# Patient Record
Sex: Female | Born: 1945 | Race: White | Hispanic: No | Marital: Married | Smoking: Never smoker
Health system: Southern US, Community
[De-identification: ages and names within clinical notes are randomized; demographics above are authoritative.]

## PROBLEM LIST (undated history)

## (undated) DIAGNOSIS — M797 Fibromyalgia: Secondary | ICD-10-CM

## (undated) DIAGNOSIS — M1990 Unspecified osteoarthritis, unspecified site: Secondary | ICD-10-CM

## (undated) DIAGNOSIS — C50919 Malignant neoplasm of unspecified site of unspecified female breast: Secondary | ICD-10-CM

## (undated) DIAGNOSIS — K219 Gastro-esophageal reflux disease without esophagitis: Secondary | ICD-10-CM

## (undated) DIAGNOSIS — E079 Disorder of thyroid, unspecified: Secondary | ICD-10-CM

## (undated) DIAGNOSIS — N200 Calculus of kidney: Secondary | ICD-10-CM

## (undated) DIAGNOSIS — G629 Polyneuropathy, unspecified: Secondary | ICD-10-CM

## (undated) DIAGNOSIS — M889 Osteitis deformans of unspecified bone: Secondary | ICD-10-CM

## (undated) DIAGNOSIS — G4730 Sleep apnea, unspecified: Secondary | ICD-10-CM

## (undated) DIAGNOSIS — E119 Type 2 diabetes mellitus without complications: Secondary | ICD-10-CM

## (undated) DIAGNOSIS — I10 Essential (primary) hypertension: Secondary | ICD-10-CM

## (undated) HISTORY — PX: ABDOMINAL HYSTERECTOMY: SHX81

## (undated) HISTORY — PX: KIDNEY STONE SURGERY: SHX686

## (undated) HISTORY — PX: TONSILLECTOMY: SUR1361

## (undated) HISTORY — PX: BREAST LUMPECTOMY: SHX2

---

## 2018-02-28 ENCOUNTER — Emergency Department (HOSPITAL_COMMUNITY): Payer: Medicare Other

## 2018-02-28 ENCOUNTER — Encounter (HOSPITAL_COMMUNITY): Payer: Self-pay | Admitting: Emergency Medicine

## 2018-02-28 ENCOUNTER — Other Ambulatory Visit: Payer: Self-pay

## 2018-02-28 ENCOUNTER — Emergency Department (HOSPITAL_COMMUNITY)
Admission: EM | Admit: 2018-02-28 | Discharge: 2018-02-28 | Disposition: A | Discharge status: Home / Self Care | Payer: Medicare Other | Attending: Emergency Medicine | Admitting: Emergency Medicine

## 2018-02-28 DIAGNOSIS — S0083XA Contusion of other part of head, initial encounter: Secondary | ICD-10-CM | POA: Diagnosis not present

## 2018-02-28 DIAGNOSIS — S80211A Abrasion, right knee, initial encounter: Secondary | ICD-10-CM | POA: Insufficient documentation

## 2018-02-28 DIAGNOSIS — S0232XA Fracture of orbital floor, left side, initial encounter for closed fracture: Secondary | ICD-10-CM | POA: Insufficient documentation

## 2018-02-28 DIAGNOSIS — Y92524 Gas station as the place of occurrence of the external cause: Secondary | ICD-10-CM | POA: Diagnosis not present

## 2018-02-28 DIAGNOSIS — S80212A Abrasion, left knee, initial encounter: Secondary | ICD-10-CM | POA: Diagnosis not present

## 2018-02-28 DIAGNOSIS — I10 Essential (primary) hypertension: Secondary | ICD-10-CM | POA: Diagnosis not present

## 2018-02-28 DIAGNOSIS — S42202A Unspecified fracture of upper end of left humerus, initial encounter for closed fracture: Secondary | ICD-10-CM | POA: Insufficient documentation

## 2018-02-28 DIAGNOSIS — W010XXA Fall on same level from slipping, tripping and stumbling without subsequent striking against object, initial encounter: Secondary | ICD-10-CM | POA: Insufficient documentation

## 2018-02-28 DIAGNOSIS — W19XXXA Unspecified fall, initial encounter: Secondary | ICD-10-CM

## 2018-02-28 DIAGNOSIS — Y999 Unspecified external cause status: Secondary | ICD-10-CM | POA: Diagnosis not present

## 2018-02-28 DIAGNOSIS — S0240DA Maxillary fracture, left side, initial encounter for closed fracture: Secondary | ICD-10-CM | POA: Insufficient documentation

## 2018-02-28 DIAGNOSIS — Z79899 Other long term (current) drug therapy: Secondary | ICD-10-CM | POA: Diagnosis not present

## 2018-02-28 DIAGNOSIS — E079 Disorder of thyroid, unspecified: Secondary | ICD-10-CM | POA: Diagnosis not present

## 2018-02-28 DIAGNOSIS — Z7984 Long term (current) use of oral hypoglycemic drugs: Secondary | ICD-10-CM | POA: Insufficient documentation

## 2018-02-28 DIAGNOSIS — S50312A Abrasion of left elbow, initial encounter: Secondary | ICD-10-CM | POA: Diagnosis not present

## 2018-02-28 DIAGNOSIS — E119 Type 2 diabetes mellitus without complications: Secondary | ICD-10-CM | POA: Insufficient documentation

## 2018-02-28 DIAGNOSIS — Y9389 Activity, other specified: Secondary | ICD-10-CM | POA: Insufficient documentation

## 2018-02-28 DIAGNOSIS — S0990XA Unspecified injury of head, initial encounter: Secondary | ICD-10-CM | POA: Diagnosis present

## 2018-02-28 HISTORY — DX: Unspecified osteoarthritis, unspecified site: M19.90

## 2018-02-28 HISTORY — DX: Calculus of kidney: N20.0

## 2018-02-28 HISTORY — DX: Essential (primary) hypertension: I10

## 2018-02-28 HISTORY — DX: Sleep apnea, unspecified: G47.30

## 2018-02-28 HISTORY — DX: Polyneuropathy, unspecified: G62.9

## 2018-02-28 HISTORY — DX: Osteitis deformans of unspecified bone: M88.9

## 2018-02-28 HISTORY — DX: Malignant neoplasm of unspecified site of unspecified female breast: C50.919

## 2018-02-28 HISTORY — DX: Fibromyalgia: M79.7

## 2018-02-28 HISTORY — DX: Type 2 diabetes mellitus without complications: E11.9

## 2018-02-28 HISTORY — DX: Disorder of thyroid, unspecified: E07.9

## 2018-02-28 HISTORY — DX: Gastro-esophageal reflux disease without esophagitis: K21.9

## 2018-02-28 MED ORDER — CLINDAMYCIN HCL 150 MG PO CAPS: ORAL_CAPSULE | ORAL | 0 refills | Status: DC

## 2018-02-28 MED ORDER — OXYCODONE-ACETAMINOPHEN 5-325 MG PO TABS
1.0000 | ORAL_TABLET | Freq: Once | ORAL | Status: AC
  Administered 2018-02-28: 1 via ORAL
  Filled 2018-02-28: qty 1

## 2018-02-28 MED ORDER — TETRACAINE HCL 0.5 % OP SOLN
OPHTHALMIC | Status: AC
  Administered 2018-02-28: 20:00:00
  Filled 2018-02-28: qty 4

## 2018-02-28 MED ORDER — FLUORESCEIN SODIUM 1 MG OP STRP
1.0000 | ORAL_STRIP | Freq: Once | OPHTHALMIC | Status: AC
  Administered 2018-02-28: 1 via OPHTHALMIC
  Filled 2018-02-28: qty 1

## 2018-02-28 MED ORDER — OXYCODONE-ACETAMINOPHEN 5-325 MG PO TABS: ORAL_TABLET | ORAL | 0 refills | Status: DC

## 2018-02-28 NOTE — ED Notes (Signed)
ED Provider at bedside. 
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
## 2018-02-28 NOTE — Discharge Instructions (Signed)
Take the prescriptions as directed.  You will want to keep your head slightly elevated for the next few days, especially at night, to help decrease the swelling around your eye area. Apply ice to the area(s) of discomfort, for 15 minutes at a time, several times per day for the next few days.  Do not fall asleep on a heating or ice pack.  Wear the sling until you are seen in follow up. No NOT: forcefully blow your nose or sneeze, sneeze with your mouth open if needed, no chewing, no gum, no candy. Eat a liquid diet until seen in follow up by the ENT doctor. Call your regular medical doctor tomorrow to schedule a follow up appointment this week.  You will also need to call the Orthopedic doctor tomorrow to schedule a follow up appointment within the next week for your proximal humeral fracture.  You will also want to call the ENT doctor tomorrow to schedule a follow up appointment within the next week regarding your orbital floor and maxillary sinus bone fractures.  Return to the Emergency Department immediately if worsening.

## 2018-03-06 ENCOUNTER — Telehealth: Payer: Self-pay | Admitting: Orthopedic Surgery

## 2018-03-06 ENCOUNTER — Ambulatory Visit (INDEPENDENT_AMBULATORY_CARE_PROVIDER_SITE_OTHER): Payer: Medicare Other | Admitting: Orthopedic Surgery

## 2018-03-06 ENCOUNTER — Encounter: Payer: Self-pay | Admitting: Orthopedic Surgery

## 2018-03-06 VITALS — BP 153/84 | HR 81 | Ht 61.0 in | Wt 176.0 lb

## 2018-03-06 DIAGNOSIS — S42255A Nondisplaced fracture of greater tuberosity of left humerus, initial encounter for closed fracture: Secondary | ICD-10-CM

## 2018-03-06 MED ORDER — HYDROCODONE-ACETAMINOPHEN 5-325 MG PO TABS: 1.0000 | ORAL_TABLET | ORAL | 0 refills | Status: AC | PRN

## 2018-03-06 NOTE — Telephone Encounter (Signed)
Spoke with patient, called today following emergency room visit at [facility]. Scheduled to come to see Dr Aline Brochure for appointment today, 03/06/18; aware.
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
## 2018-03-16 DIAGNOSIS — S42202A Unspecified fracture of upper end of left humerus, initial encounter for closed fracture: Secondary | ICD-10-CM | POA: Insufficient documentation

## 2018-03-20 ENCOUNTER — Encounter: Payer: Self-pay | Admitting: Orthopedic Surgery

## 2018-03-20 ENCOUNTER — Ambulatory Visit (INDEPENDENT_AMBULATORY_CARE_PROVIDER_SITE_OTHER): Payer: Medicare Other | Admitting: Orthopedic Surgery

## 2018-03-20 ENCOUNTER — Ambulatory Visit (INDEPENDENT_AMBULATORY_CARE_PROVIDER_SITE_OTHER): Payer: Medicare Other

## 2018-03-20 VITALS — BP 139/60 | HR 79 | Ht 61.0 in | Wt 176.0 lb

## 2018-03-20 DIAGNOSIS — S42295A Other nondisplaced fracture of upper end of left humerus, initial encounter for closed fracture: Secondary | ICD-10-CM

## 2018-03-20 MED ORDER — IBUPROFEN 800 MG PO TABS: 800.0000 mg | ORAL_TABLET | Freq: Three times a day (TID) | ORAL | 1 refills | Status: DC | PRN

## 2018-03-20 MED ORDER — HYDROCODONE-ACETAMINOPHEN 5-325 MG PO TABS: 1.0000 | ORAL_TABLET | Freq: Four times a day (QID) | ORAL | 0 refills | Status: AC | PRN

## 2018-03-20 NOTE — Progress Notes (Signed)
Chief Complaint  Patient presents with  . Shoulder Pain    left   Injury date July 2  20 days post left proximal humerus fracture treated with sling  Complains of pain, taking ibuprofen 400 mg as needed  Sensory exam lateral shoulder normal swelling has decreased distal neurovascular function intact  X-ray shows stable fracture without regulation or displacement  Recommend start physical therapy return in 6 to 8 weeks  Encounter Diagnosis  Name Primary?  . Other closed nondisplaced fracture of proximal end of left humerus, initial encounter 02/28/18 Yes     Meds ordered this encounter  Medications  . ibuprofen (ADVIL,MOTRIN) 800 MG tablet    Sig: Take 1 tablet (800 mg total) by mouth every 8 (eight) hours as needed.    Dispense:  90 tablet    Refill:  1  . HYDROcodone-acetaminophen (NORCO/VICODIN) 5-325 MG tablet    Sig: Take 1 tablet by mouth every 6 (six) hours as needed for moderate pain.    Dispense:  30 tablet    Refill:  0

## 2018-03-21 ENCOUNTER — Telehealth: Payer: Self-pay | Admitting: Orthopedic Surgery

## 2018-03-21 NOTE — Telephone Encounter (Signed)
I received a call from [facility] in [place].  She said they received an order for PT for this patient.  She said the patient requested Gretchen Short to be her therapist.  The lady I spoke to said that Ms. Sherral Hammers does not work there at [employer], she works at [employer].  She gave the phone number for [facility] [phone] but did not know the fax phone.  She said to let her know if the patient was going to use [facility] or [facility] for her therapy.

## 2018-03-21 NOTE — Telephone Encounter (Signed)
Left message for [facility] to see if they will accept our referral, and if so what is the fax number.

## 2018-03-21 NOTE — Telephone Encounter (Signed)
They will call her to schedule 

## 2018-03-21 NOTE — Telephone Encounter (Signed)
They will take the referral fax number is [phone] I have faxed the referral

## 2018-04-10 ENCOUNTER — Telehealth: Payer: Self-pay | Admitting: Orthopedic Surgery

## 2018-04-10 NOTE — Telephone Encounter (Signed)
Gretchen Short with [facility] called stating that she needed clarification. Stated the patient is asking her when can she come out of the sling.  You can reach her at [phone] ext# [phone], if not it is a secure line so you can leave a message. Stated we can call [phone] also.  Please call and advise.

## 2018-04-12 NOTE — Telephone Encounter (Signed)
Physical therapist can wean her from sling as she progresses. I have called to advise, left message.

## 2018-05-02 ENCOUNTER — Other Ambulatory Visit: Payer: Self-pay | Admitting: Orthopedic Surgery

## 2018-05-02 DIAGNOSIS — S42295A Other nondisplaced fracture of upper end of left humerus, initial encounter for closed fracture: Secondary | ICD-10-CM

## 2018-05-22 ENCOUNTER — Ambulatory Visit (INDEPENDENT_AMBULATORY_CARE_PROVIDER_SITE_OTHER): Payer: Medicare Other | Admitting: Orthopedic Surgery

## 2018-05-22 ENCOUNTER — Encounter: Payer: Self-pay | Admitting: Orthopedic Surgery

## 2018-05-22 VITALS — BP 138/73 | HR 80 | Ht 61.0 in | Wt 183.0 lb

## 2018-05-22 DIAGNOSIS — S42295A Other nondisplaced fracture of upper end of left humerus, initial encounter for closed fracture: Secondary | ICD-10-CM

## 2018-05-22 MED ORDER — IBUPROFEN 800 MG PO TABS: 800.0000 mg | ORAL_TABLET | Freq: Three times a day (TID) | ORAL | 1 refills | Status: AC | PRN

## 2018-05-22 NOTE — Progress Notes (Signed)
Fracture care follow-up  Chief Complaint  Patient presents with  . Arm Injury    Left humeral fx 02/28/18    Encounter Diagnosis  Name Primary?  . Other closed nondisplaced fracture of proximal end of left humerus, initial encounter 02/28/18 Yes    Left proximal humerus fracture nondisplaced treated nonoperatively patient has recovered well complains of some soreness  BP 138/73   Pulse 80   Ht 5\' 1"  (1.549 m)   Wt 183 lb (83 kg)   BMI 34.58 kg/m   Physical Exam  Musculoskeletal:       Arms:    Xrays: Nondisplaced left proximal humerus fracture involves the greater tuberosity but it is nondisplaced surgical neck fracture as well.  X-ray was taken on July 22  Plan  Should be okay with home exercises and ibuprofen follow-up as needed released

## 2018-05-22 NOTE — Patient Instructions (Signed)
Home exercises  Ibuprofen as needed

## 2019-11-07 IMAGING — CT CT HEAD W/O CM
3 of 13 series · 14 of 47 positions shown, 16 images · non-contrast
Comparison: None.

CLINICAL DATA: Initial evaluation for acute head trauma.  Fall.

EXAM:
CT HEAD WITHOUT CONTRAST
CT MAXILLOFACIAL WITHOUT CONTRAST
CT CERVICAL SPINE WITHOUT CONTRAST
TECHNIQUE: Multidetector CT imaging of the head, cervical spine, and
maxillofacial structures were performed using the standard protocol
without intravenous contrast. Multiplanar CT image reconstructions
of the cervical spine and maxillofacial structures were also
generated.

[Series 7: max soft · axial · 0.33mm/px · z∈[+248,+368]mm · 6 of 85 slices shown]
[im 13/85  brain]
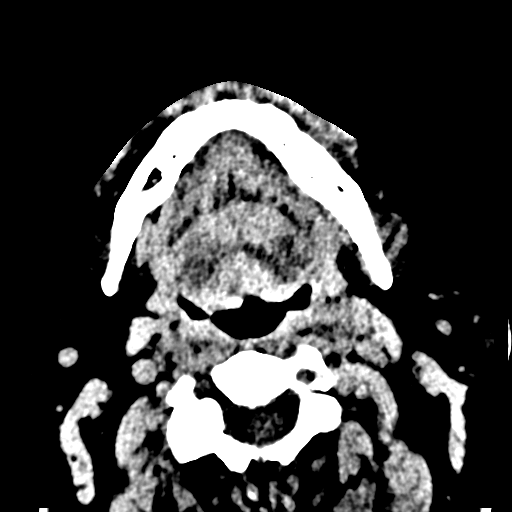
[im 25/85  brain]
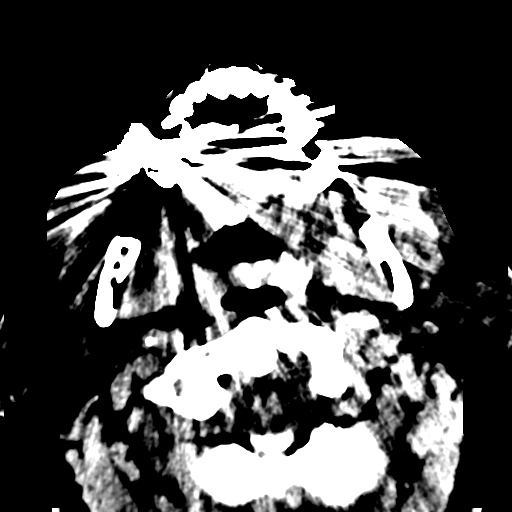
[im 37/85  brain]
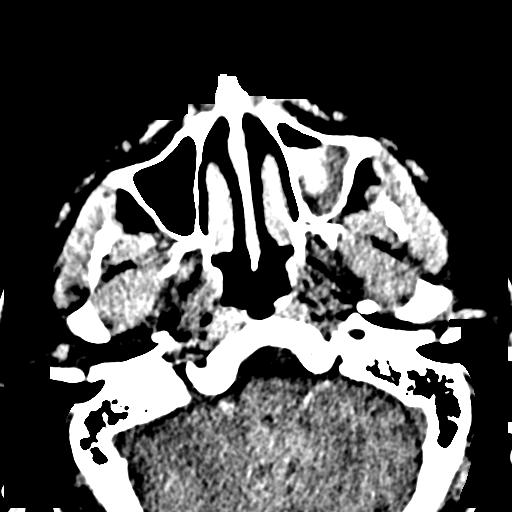
[im 49/85  brain]
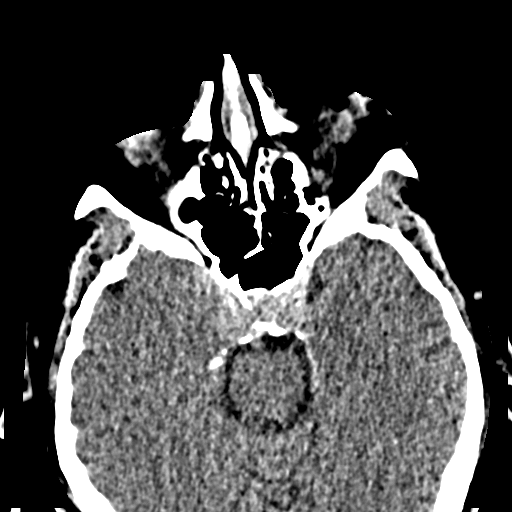
[im 61/85  brain]
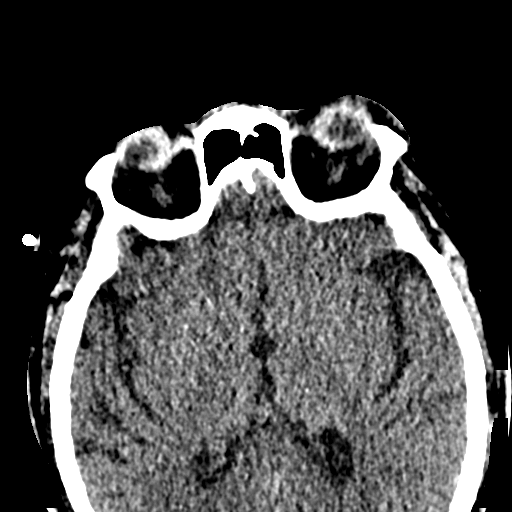
[im 73/85  brain]
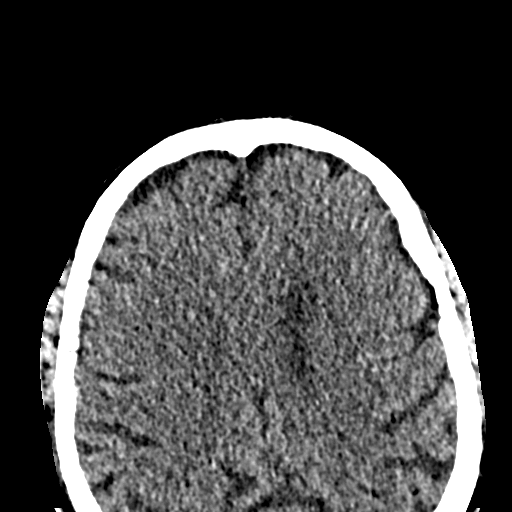

[Series 15: coronal soft · coronal · 0.35mm/px · 1 of 90 slices shown]
[im 45/90  brain]
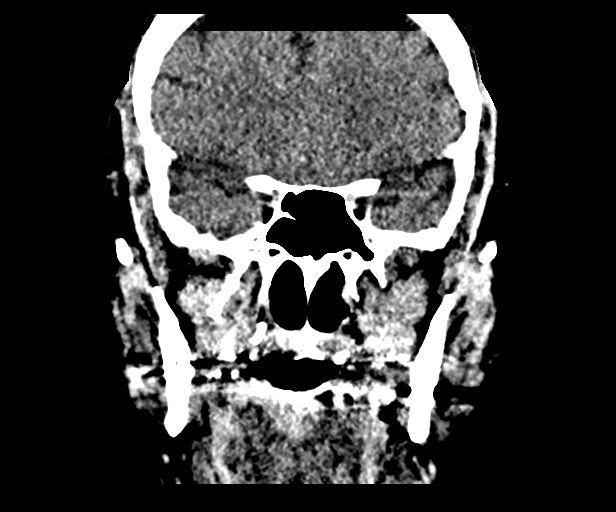

[Series 28: orthogonal axials · oblique · 0.21mm/px · 7 of 97 slices shown, 9 images]
[im 13/97  brain]
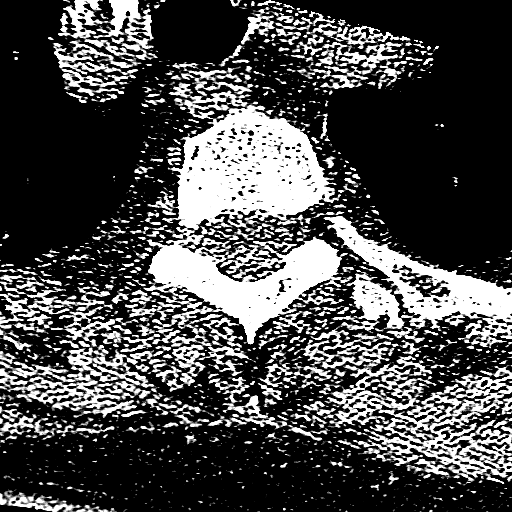
[im 13/97  bone]
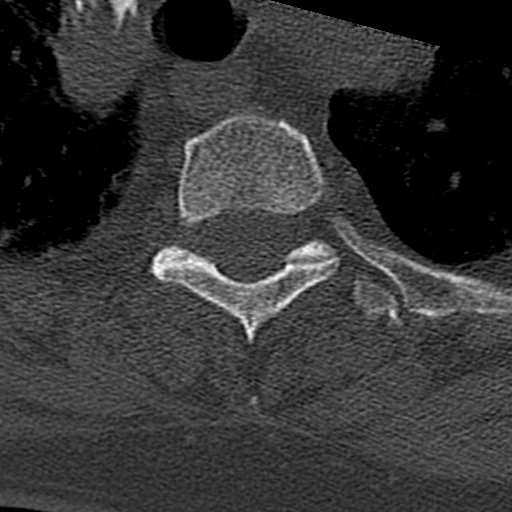
[im 25/97  brain]
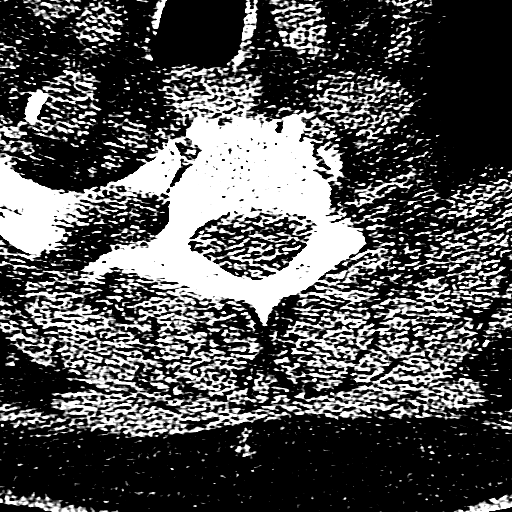
[im 37/97  brain]
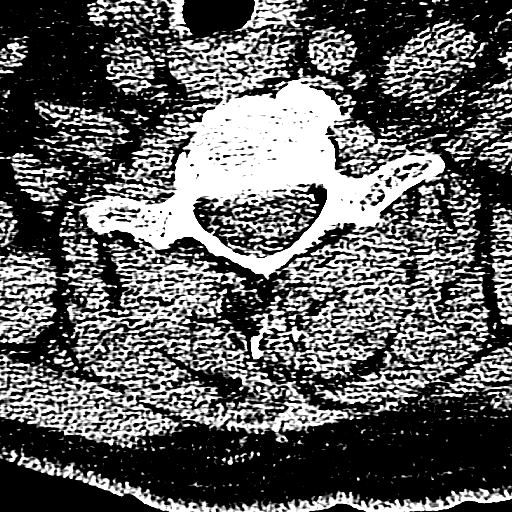
[im 49/97  brain]
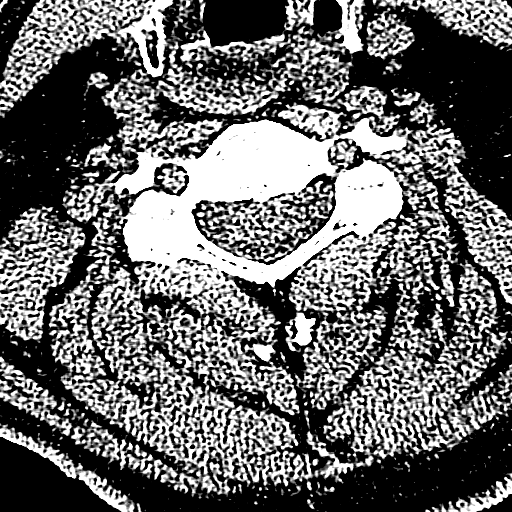
[im 61/97  brain]
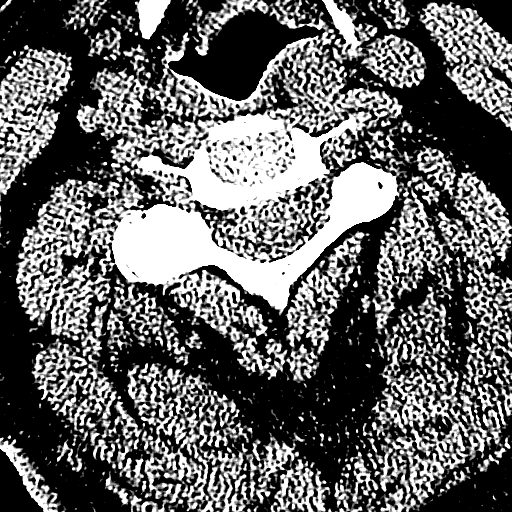
[im 61/97  bone]
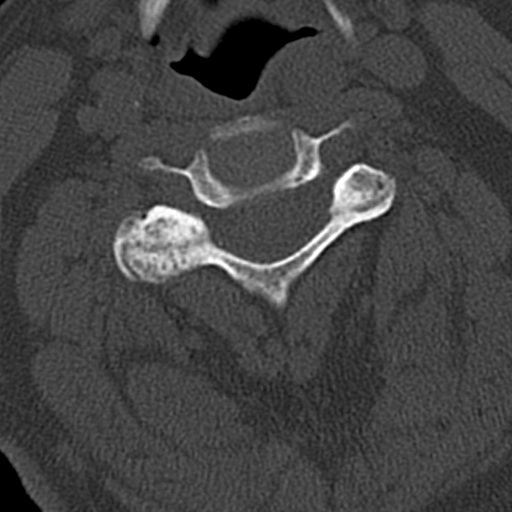
[im 73/97  brain]
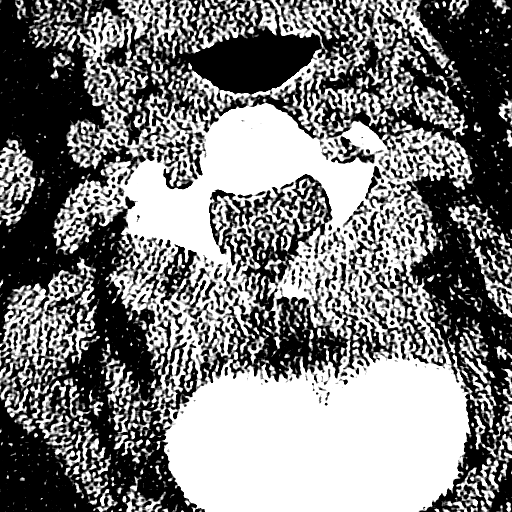
[im 85/97  brain]
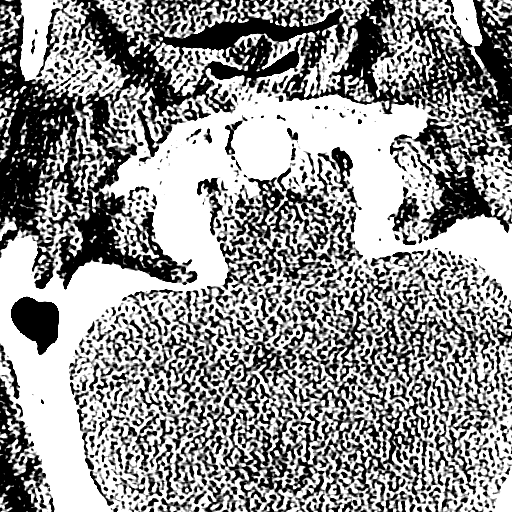

[14 of 47 positions shown; findings below may reference images not displayed]

FINDINGS: CT HEAD FINDINGS

Brain: Age-related cerebral volume loss. Mild chronic small vessel
ischemic disease. No acute intracranial hemorrhage. No acute large
vessel territory infarct. No mass lesion, midline shift or mass
effect. No hydrocephalus. No extra-axial fluid collection.

Vascular: No hyperdense vessel. Scattered vascular calcifications
noted within the carotid siphons.

Skull: Left periorbital soft tissue contusion.  Calvarium intact.

Other: Mastoid air cells are clear.

CT MAXILLOFACIAL FINDINGS

Osseous: The zygomatic arches intact. Acute fracture involving the
superior left maxilla related to the left orbital floor fracture.
Maxilla otherwise intact. Pterygoid plates intact. No acute nasal
bone fracture. Nasal septum intact. Mandible intact. Mandibular
condyles normally situated. Degenerative changes noted about the
right TMJ. No acute abnormality about the dentition.

Orbits: Acute fracture extending through the left orbital floor with
3 mm of inferior displacement. Extra-ocular muscles remain normally
position within the bony left orbit. No retro-orbital hematoma or
other pathology. Globes intact. Patient status post ocular lens
replacement bilaterally. Bony orbits otherwise intact.

Sinuses: Left maxillary sinus largely opacified with admixture of
blood and fluid. Paranasal sinuses otherwise largely clear.

Soft tissues: Left periorbital contusion with associated swelling
and soft tissue emphysema.

CT CERVICAL SPINE FINDINGS

Alignment: Straightening of the normal cervical lordosis. No
listhesis.

Skull base and vertebrae: Skull base intact. Normal C1-2
articulations are preserved in the dens is intact. Vertebral body
heights maintained. No acute fracture.

Soft tissues and spinal canal: Soft tissues of the neck demonstrate
no acute finding. No abnormal prevertebral edema. Vascular
calcifications about the carotid bifurcations. Spinal canal within
normal limits.

Disc levels: Prominent right-sided facet arthrosis noted at C2-3 and
C3-4.

Upper chest: Visualized upper chest demonstrates no acute finding.
Emphysematous changes noted at the lung apices.

Other: None.
IMPRESSION: CT HEAD:

1. No acute intracranial abnormality.
2. Age-related cerebral atrophy with mild chronic small vessel
ischemic disease.

CT MAXILLOFACIAL:

1. Acute left orbital floor fracture with 3 mm of inferior
displacement. Intact globes with no retro-orbital hematoma or other
process.
2. Left periorbital/facial contusion.
3. Prominent degenerative changes at the right TMJ.

CT CERVICAL SPINE:

No acute traumatic injury within the cervical spine.

## 2023-03-01 ENCOUNTER — Encounter (HOSPITAL_BASED_OUTPATIENT_CLINIC_OR_DEPARTMENT_OTHER): Payer: Self-pay

## 2023-03-01 DIAGNOSIS — R0683 Snoring: Secondary | ICD-10-CM

## 2023-03-01 DIAGNOSIS — G4733 Obstructive sleep apnea (adult) (pediatric): Secondary | ICD-10-CM

## 2023-03-10 ENCOUNTER — Other Ambulatory Visit (HOSPITAL_COMMUNITY): Payer: Self-pay | Admitting: Adult Health Nurse Practitioner

## 2023-03-10 DIAGNOSIS — M8589 Other specified disorders of bone density and structure, multiple sites: Secondary | ICD-10-CM

## 2023-03-31 ENCOUNTER — Ambulatory Visit (HOSPITAL_COMMUNITY)
Admission: RE | Admit: 2023-03-31 | Discharge: 2023-03-31 | Disposition: A | Discharge status: Home / Self Care | Payer: Medicare Other | Source: Ambulatory Visit | Attending: Adult Health Nurse Practitioner | Admitting: Adult Health Nurse Practitioner

## 2023-03-31 DIAGNOSIS — M8589 Other specified disorders of bone density and structure, multiple sites: Secondary | ICD-10-CM | POA: Insufficient documentation

## 2023-05-09 ENCOUNTER — Encounter: Payer: Medicare Other | Admitting: Pulmonary Disease

## 2023-07-04 ENCOUNTER — Other Ambulatory Visit (HOSPITAL_COMMUNITY): Payer: Self-pay | Admitting: Adult Health Nurse Practitioner

## 2023-07-04 ENCOUNTER — Ambulatory Visit (HOSPITAL_COMMUNITY)
Admission: RE | Admit: 2023-07-04 | Discharge: 2023-07-04 | Disposition: A | Discharge status: Home / Self Care | Payer: Medicare Other | Source: Ambulatory Visit | Attending: Adult Health Nurse Practitioner | Admitting: Adult Health Nurse Practitioner

## 2023-07-04 DIAGNOSIS — J1282 Pneumonia due to coronavirus disease 2019: Secondary | ICD-10-CM

## 2023-07-04 DIAGNOSIS — U071 COVID-19: Secondary | ICD-10-CM | POA: Insufficient documentation

## 2023-07-22 ENCOUNTER — Other Ambulatory Visit (HOSPITAL_COMMUNITY): Payer: Self-pay | Admitting: Adult Health Nurse Practitioner

## 2023-07-22 DIAGNOSIS — U071 COVID-19: Secondary | ICD-10-CM

## 2023-07-25 ENCOUNTER — Ambulatory Visit (HOSPITAL_COMMUNITY)
Admission: RE | Admit: 2023-07-25 | Discharge: 2023-07-25 | Disposition: A | Discharge status: Home / Self Care | Payer: Medicare Other | Source: Ambulatory Visit | Attending: Adult Health Nurse Practitioner | Admitting: Adult Health Nurse Practitioner

## 2023-07-25 DIAGNOSIS — J1282 Pneumonia due to coronavirus disease 2019: Secondary | ICD-10-CM | POA: Insufficient documentation

## 2023-07-25 DIAGNOSIS — U071 COVID-19: Secondary | ICD-10-CM | POA: Diagnosis present

## 2023-07-29 ENCOUNTER — Other Ambulatory Visit: Payer: Self-pay | Admitting: Adult Health Nurse Practitioner

## 2023-07-29 DIAGNOSIS — R918 Other nonspecific abnormal finding of lung field: Secondary | ICD-10-CM

## 2023-08-01 ENCOUNTER — Ambulatory Visit (HOSPITAL_COMMUNITY)
Admission: RE | Admit: 2023-08-01 | Discharge: 2023-08-01 | Disposition: A | Discharge status: Home / Self Care | Payer: Medicare Other | Source: Ambulatory Visit | Attending: Adult Health Nurse Practitioner | Admitting: Adult Health Nurse Practitioner

## 2023-08-01 DIAGNOSIS — R918 Other nonspecific abnormal finding of lung field: Secondary | ICD-10-CM | POA: Insufficient documentation
